# Patient Record
Sex: Female | Born: 1995 | Race: Black or African American | Hispanic: No | Marital: Single | Smoking: Light tobacco smoker
Health system: Southern US, Community
[De-identification: ages and names within clinical notes are randomized; demographics above are authoritative.]

## PROBLEM LIST (undated history)

## (undated) ENCOUNTER — Inpatient Hospital Stay (HOSPITAL_COMMUNITY): Payer: Self-pay

## (undated) DIAGNOSIS — A549 Gonococcal infection, unspecified: Secondary | ICD-10-CM

## (undated) DIAGNOSIS — N390 Urinary tract infection, site not specified: Secondary | ICD-10-CM

## (undated) DIAGNOSIS — A749 Chlamydial infection, unspecified: Secondary | ICD-10-CM

## (undated) HISTORY — PX: NO PAST SURGERIES: SHX2092

---
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
## 2017-08-22 ENCOUNTER — Encounter (HOSPITAL_COMMUNITY): Payer: Self-pay | Admitting: Emergency Medicine

## 2017-08-22 ENCOUNTER — Other Ambulatory Visit: Payer: Self-pay

## 2017-08-22 ENCOUNTER — Ambulatory Visit (HOSPITAL_COMMUNITY)
Admission: EM | Admit: 2017-08-22 | Discharge: 2017-08-22 | Disposition: A | Discharge status: Home / Self Care | Payer: Self-pay | Attending: Family Medicine | Admitting: Family Medicine

## 2017-08-22 DIAGNOSIS — M79644 Pain in right finger(s): Secondary | ICD-10-CM

## 2017-08-22 DIAGNOSIS — Z23 Encounter for immunization: Secondary | ICD-10-CM

## 2017-08-22 DIAGNOSIS — S60452A Superficial foreign body of right middle finger, initial encounter: Secondary | ICD-10-CM

## 2017-08-22 DIAGNOSIS — S60459A Superficial foreign body of unspecified finger, initial encounter: Secondary | ICD-10-CM

## 2017-08-22 MED ORDER — IBUPROFEN 800 MG PO TABS
800.0000 mg | ORAL_TABLET | Freq: Three times a day (TID) | ORAL | 0 refills | Status: DC
Start: 2017-08-22 — End: 2023-11-29

## 2017-08-22 MED ORDER — TETANUS-DIPHTH-ACELL PERTUSSIS 5-2.5-18.5 LF-MCG/0.5 IM SUSP
0.5000 mL | Freq: Once | INTRAMUSCULAR | Status: AC
  Administered 2017-08-22: 0.5 mL via INTRAMUSCULAR

## 2017-08-22 MED ORDER — CEPHALEXIN 500 MG PO CAPS: 500.0000 mg | ORAL_CAPSULE | Freq: Four times a day (QID) | ORAL | 0 refills | Status: AC

## 2017-08-22 MED ORDER — TETANUS-DIPHTH-ACELL PERTUSSIS 5-2.5-18.5 LF-MCG/0.5 IM SUSP
INTRAMUSCULAR | Status: AC
  Filled 2017-08-22: qty 0.5

## 2017-08-22 NOTE — Discharge Instructions (Signed)
Please begin Keflex to prevent infection Please monitor for signs of infection including increased redness, swelling, pain, drainage from the area, not healing If causing you significant pain, limiting your use of your finger please follow-up with hand specialist for further evaluation and removal.  Please take Tylenol or ibuprofen to help with your pain

## 2017-08-22 NOTE — ED Triage Notes (Signed)
Right middle finger injury occurred today.  Patient reports she was digging in purse and something poked finger.  Site is painful and she thinks she sees something in finger

## 2017-08-23 NOTE — ED Provider Notes (Signed)
[facility]    CSN: [id number] Arrival date & time: 08/22/17  1116     History   Chief Complaint Chief Complaint  Patient presents with  . Puncture Wound  . Finger Injury    HPI Sue Maldonado is a 22 y.o. female no significant past medical history presenting today for evaluation of right middle finger foreign body.  She states she was looking through her purse and something poked her finger.  She is unsure what this was, she states she was looking to her jewelry bag and may have been a comb.  She is here to have this piece removed.  Unsure last tetanus.  HPI  Past Medical History:  Diagnosis Date  . Chlamydia   . Gonorrhea   . UTI (lower urinary tract infection)     Patient Active Problem List   Diagnosis Date Noted  . NSVD (normal spontaneous vaginal delivery) 06/15/2015  . Gestational hypertension 06/13/2015    Past Surgical History:  Procedure Laterality Date  . NO PAST SURGERIES      OB History    Gravida  1   Para  1   Term  1   Preterm      AB      Living  1     SAB      TAB      Ectopic      Multiple  0   Live Births  1            Home Medications    Prior to Admission medications   Medication Sig Start Date End Date Taking? Authorizing Provider  cephALEXin (KEFLEX) 500 MG capsule Take 1 capsule (500 mg total) by mouth 4 (four) times daily for 5 days. 08/22/17 08/27/17  Jaydin Boniface C, PA-C  ibuprofen (ADVIL,MOTRIN) 800 MG tablet Take 1 tablet (800 mg total) by mouth 3 (three) times daily. 08/22/17   Kourtni Stineman C, PA-C  Prenatal Vit-Fe Fumarate-FA (PRENATAL MULTIVITAMIN) TABS tablet Take 1 tablet by mouth daily at 12 noon.    [provider]    Family History Family History  Problem Relation Age of Onset  . Cancer Maternal Aunt   . Hypertension Maternal Grandmother     Social History Social History   Tobacco Use  . Smoking status: Former Smoker  Substance Use Topics  . Alcohol use: No  . Drug  use: Yes    Types: Marijuana    Comment: 6 months ago     Allergies   Patient has no known allergies.   Review of Systems Review of Systems  Constitutional: Negative for fatigue and fever.  Eyes: Negative for visual disturbance.  Respiratory: Negative for shortness of breath.   Cardiovascular: Negative for chest pain.  Gastrointestinal: Negative for abdominal pain, nausea and vomiting.  Musculoskeletal: Negative for arthralgias and joint swelling.  Skin: Positive for color change. Negative for rash and wound.       Foreign body  Neurological: Negative for dizziness, weakness, light-headedness and headaches.     Physical Exam Triage Vital Signs ED Triage Vitals  Enc Vitals Group     BP 08/22/17 1222 (!) 132/95     Pulse Rate 08/22/17 1222 68     Resp 08/22/17 1222 16     Temp 08/22/17 1222 98.6 F (37 C)     Temp Source 08/22/17 1222 Oral     SpO2 08/22/17 1222 100 %     Weight --      Height --  Head Circumference --      Peak Flow --      Pain Score 08/22/17 1241 7     Pain Loc --      Pain Edu? --      Excl. in GC? --    No data found.  Updated Vital Signs BP (!) 132/95 (BP Location: Right Arm)   Pulse 68   Temp 98.6 F (37 C) (Oral)   Resp 16   LMP 08/21/2017   SpO2 100%   Visual Acuity Right Eye Distance:   Left Eye Distance:   Bilateral Distance:    Right Eye Near:   Left Eye Near:    Bilateral Near:     Physical Exam  Constitutional: She is oriented to person, place, and time. She appears well-developed and well-nourished.  No acute distress  HENT:  Head: Normocephalic and atraumatic.  Nose: Nose normal.  Eyes: Conjunctivae are normal.  Neck: Neck supple.  Cardiovascular: Normal rate.  Pulmonary/Chest: Effort normal. No respiratory distress.  Abdominal: She exhibits no distension.  Musculoskeletal: Normal range of motion.  Neurological: She is alert and oriented to person, place, and time.  Skin: Skin is warm and dry.  Visible  foreign body palpable and distal pulp of right middle finger.  Psychiatric: She has a normal mood and affect.  Nursing note and vitals reviewed.    UC Treatments / Results  Labs (all labs ordered are listed, but only abnormal results are displayed) Labs Reviewed - No data to display  EKG None  Radiology No results found.  Procedures Foreign Body Removal Date/Time: 08/23/2017 4:07 PM Performed by: Isra Lindy, Junius Creamer, PA-C Authorized by: Tyrone Nine, MD   Consent:    Consent obtained:  Verbal   Consent given by:  Patient   Risks discussed:  Pain, worsening of condition, incomplete removal, bleeding and infection   Alternatives discussed:  No treatment and alternative treatment Location:    Location:  Finger   Finger location:  R middle finger   Depth:  Intradermal   Tendon involvement:  None Pre-procedure details:    Imaging:  None   Neurovascular status: intact     Preparation: Patient was prepped and draped in usual sterile fashion   Anesthesia (see MAR for exact dosages):    Anesthesia method:  Nerve block   Block location:  Base of right middle finger   Block needle gauge:  27 G   Block anesthetic:  Lidocaine 2% w/o epi   Block technique:  2 separate insertion points   Block injection procedure:  Introduced needle and anatomic landmarks identified   Block outcome:  Anesthesia achieved Procedure type:    Procedure complexity:  Complex Procedure details:    Localization method:  Probed   Bloodless field: no     Removal mechanism:  Irrigation and forceps   Foreign bodies recovered:  None   Intact foreign body removal: no   Post-procedure details:    Neurovascular status: intact     Confirmation:  Residual foreign bodies remain   Skin closure:  None   Dressing:  Open (no dressing)   Patient tolerance of procedure:  Tolerated well, no immediate complications Comments:     No immediate complications   (including critical care time)  Medications Ordered in  UC Medications  Tdap (BOOSTRIX) injection 0.5 mL (0.5 mLs Intramuscular Given 08/22/17 1421)    Initial Impression / Assessment and Plan / UC Course  I have reviewed the triage vital signs and  the nursing notes.  Pertinent labs & imaging results that were available during my care of the patient were reviewed by me and considered in my medical decision making (see chart for details).     Given location on finger pulp, incomplete removal of foreign body, length of time of manipulation, will go ahead and empirically put on Keflex to prevent development of swelling or infection in the distal pulp.  Discussed monitoring for infection.  Foreign body was unable to be removed, advised if this becomes bothersome to her or developing infection to please follow-up for further evaluation.  May follow-up with hand if no improvement with the removal.  Tylenol and ibuprofen for pain.  Tetanus updated.  Discussed strict return precautions. Patient verbalized understanding and is agreeable with plan.  Final Clinical Impressions(s) / UC Diagnoses   Final diagnoses:  Foreign body in skin of finger, initial encounter     Discharge Instructions     Please begin Keflex to prevent infection Please monitor for signs of infection including increased redness, swelling, pain, drainage from the area, not healing If causing you significant pain, limiting your use of your finger please follow-up with hand specialist for further evaluation and removal.  Please take Tylenol or ibuprofen to help with your pain   ED Prescriptions    Medication Sig Dispense Auth. Provider   cephALEXin (KEFLEX) 500 MG capsule Take 1 capsule (500 mg total) by mouth 4 (four) times daily for 5 days. 20 capsule Ezme Duch C, PA-C   ibuprofen (ADVIL,MOTRIN) 800 MG tablet Take 1 tablet (800 mg total) by mouth 3 (three) times daily. 21 tablet Melat Wrisley, FeltonHallie C, PA-C     Controlled Substance Prescriptions [place] Controlled Substance  Registry consulted? Not Applicable   Lew DawesWieters, Ashawn Rinehart C, [place] JerseyPA-C 08/23/17 1610

## 2017-09-10 ENCOUNTER — Encounter (HOSPITAL_COMMUNITY): Payer: Self-pay | Admitting: Emergency Medicine

## 2017-09-10 ENCOUNTER — Emergency Department (HOSPITAL_COMMUNITY): Payer: Self-pay

## 2017-09-10 ENCOUNTER — Other Ambulatory Visit: Payer: Self-pay

## 2017-09-10 ENCOUNTER — Emergency Department (HOSPITAL_COMMUNITY)
Admission: EM | Admit: 2017-09-10 | Discharge: 2017-09-10 | Disposition: A | Discharge status: Home / Self Care | Payer: Self-pay | Attending: Emergency Medicine | Admitting: Emergency Medicine

## 2017-09-10 DIAGNOSIS — Y9389 Activity, other specified: Secondary | ICD-10-CM | POA: Insufficient documentation

## 2017-09-10 DIAGNOSIS — Y998 Other external cause status: Secondary | ICD-10-CM | POA: Insufficient documentation

## 2017-09-10 DIAGNOSIS — S60452A Superficial foreign body of right middle finger, initial encounter: Secondary | ICD-10-CM | POA: Insufficient documentation

## 2017-09-10 DIAGNOSIS — X58XXXA Exposure to other specified factors, initial encounter: Secondary | ICD-10-CM | POA: Insufficient documentation

## 2017-09-10 DIAGNOSIS — Y929 Unspecified place or not applicable: Secondary | ICD-10-CM | POA: Insufficient documentation

## 2017-09-10 MED ORDER — CEPHALEXIN 500 MG PO CAPS: 500.0000 mg | ORAL_CAPSULE | Freq: Four times a day (QID) | ORAL | 0 refills | Status: AC

## 2017-09-10 MED ORDER — LIDOCAINE HCL (PF) 1 % IJ SOLN
5.0000 mL | Freq: Once | INTRAMUSCULAR | Status: AC
  Administered 2017-09-10: 5 mL via INTRADERMAL
  Filled 2017-09-10: qty 5

## 2017-09-10 NOTE — Discharge Instructions (Addendum)
Take antibiotics as prescribed. Follow up with hand specialist for foreign body removal.

## 2017-09-10 NOTE — ED Notes (Signed)
ED Provider at bedside. 

## 2017-09-10 NOTE — ED Provider Notes (Signed)
[facility] Provider Note   CSN: [id number] Arrival date & time: 09/10/17  [id number]     History   Chief Complaint Chief Complaint  Patient presents with  . Hand Pain    HPI Sue Maldonado is a 22 y.o. female.  22 year old female presents with complaint of right third finger foreign body.  Patient states that on July 3 she put her hand into her purse and believes she had a piece of her hair, stuck in her finger, went to urgent care who was unable to successfully remove the foreign body.  Patient was given Keflex prophylactically, did not fill the prescription as her insurance did not pay for it.  Patient reports developing pain and swelling in her distal right third finger, no drainage from the area.  No other complaints or concerns.     Past Medical History:  Diagnosis Date  . Chlamydia   . Gonorrhea   . UTI (lower urinary tract infection)     Patient Active Problem List   Diagnosis Date Noted  . NSVD (normal spontaneous vaginal delivery) 06/15/2015  . Gestational hypertension 06/13/2015    Past Surgical History:  Procedure Laterality Date  . NO PAST SURGERIES       OB History    Gravida  1   Para  1   Term  1   Preterm      AB      Living  1     SAB      TAB      Ectopic      Multiple  0   Live Births  1            Home Medications    Prior to Admission medications   Medication Sig Start Date End Date Taking? Authorizing Provider  cephALEXin (KEFLEX) 500 MG capsule Take 1 capsule (500 mg total) by mouth 4 (four) times daily for 7 days. 09/10/17 09/17/17  Jeannie FendMurphy, Necha Harries A, PA-C  ibuprofen (ADVIL,MOTRIN) 800 MG tablet Take 1 tablet (800 mg total) by mouth 3 (three) times daily. 08/22/17   Wieters, Hallie C, PA-C  Prenatal Vit-Fe Fumarate-FA (PRENATAL MULTIVITAMIN) TABS tablet Take 1 tablet by mouth daily at 12 noon.    [provider]    Family History Family History  Problem Relation Age of Onset   . Cancer Maternal Aunt   . Hypertension Maternal Grandmother     Social History Social History   Tobacco Use  . Smoking status: Former Games developermoker  . Smokeless tobacco: Never Used  Substance Use Topics  . Alcohol use: No  . Drug use: Yes    Types: Marijuana    Comment: 6 months ago     Allergies   Patient has no known allergies.   Review of Systems Review of Systems  Constitutional: Negative for fever.  Musculoskeletal: Negative for arthralgias.  Skin: Positive for color change and wound.  Allergic/Immunologic: Negative for immunocompromised state.  Neurological: Negative for weakness and numbness.  Hematological: Negative for adenopathy.  Psychiatric/Behavioral: Negative for confusion.  All other systems reviewed and are negative.    Physical Exam Updated Vital Signs BP (!) 129/93 (BP Location: Right Arm)   Pulse 73   Temp 98.2 F (36.8 C) (Oral)   Resp 17   Ht 5\' 4"  (1.626 m)   Wt 68 kg (150 lb)   LMP 08/21/2017   SpO2 100%   BMI 25.75 kg/m   Physical Exam  Constitutional: She is oriented  to person, place, and time. She appears well-developed and well-nourished. No distress.  HENT:  Head: Normocephalic and atraumatic.  Cardiovascular: Intact distal pulses.  Pulmonary/Chest: Effort normal.  Musculoskeletal: She exhibits tenderness. She exhibits no deformity.       Hands: Neurological: She is alert and oriented to person, place, and time.  Skin: Skin is warm and dry. She is not diaphoretic. There is erythema.  Psychiatric: She has a normal mood and affect. Her behavior is normal.  Nursing note and vitals reviewed.    ED Treatments / Results  Labs (all labs ordered are listed, but only abnormal results are displayed) Labs Reviewed - No data to display  EKG None  Radiology Dg Finger Middle Right  Result Date: 09/10/2017 CLINICAL DATA:  Injury to tip of right middle finger by an unknown object in her purse at the beginning of the month. EXAM: RIGHT  MIDDLE FINGER 2+V COMPARISON:  None. FINDINGS: Three views of the RIGHT middle finger are provided. There is a thin linear foreign body within the soft tissues ventral to the distal phalanx, projecting towards the radial aspects of the distal phalanx, measuring 7 mm. Underlying distal phalanx appears intact and normally aligned. IMPRESSION: 1. Linear foreign body within the soft tissues ventral to the distal phalanx, measuring 7 mm. 2. No osseous abnormality. Electronically Signed   By: Bary Richard M.D.   On: 09/10/2017 10:53    Procedures .Foreign Body Removal Date/Time: 09/10/2017 12:19 PM Performed by: Jeannie Fend, PA-C Authorized by: Jeannie Fend, PA-C  Consent: Verbal consent obtained. Written consent not obtained. Consent given by: patient Patient understanding: patient states understanding of the procedure being performed Patient identity confirmed: verbally with patient and arm band Body area: skin General location: upper extremity Location details: right long finger Anesthesia: digital block  Anesthesia: Local Anesthetic: lidocaine 1% without epinephrine  Sedation: Patient sedated: no  Patient restrained: no Patient cooperative: yes Localization method: probed 0 objects recovered. Patient tolerance: Patient tolerated the procedure well with no immediate complications   (including critical care time)  Medications Ordered in ED Medications  lidocaine (PF) (XYLOCAINE) 1 % injection 5 mL (5 mLs Intradermal Given 09/10/17 1112)     Initial Impression / Assessment and Plan / ED Course  I have reviewed the triage vital signs and the nursing notes.  Pertinent labs & imaging results that were available during my care of the patient were reviewed by me and considered in my medical decision making (see chart for details).  Clinical Course as of Sep 10 1228  Mon Sep 10, 2017  [id number] 22 year old female presents with foreign body in her right third finger x18 days.   Patient went to urgent care initially was unable to locate the foreign body for removal, given a prescription for Keflex which she did not take.  Patient returns today for worsening pain and swelling in her right distal third finger.  On exam there is swelling with tenderness, mild erythema, no streaking, no tenderness along the flexor extensor tendons of the finger.  X-ray shows visible foreign body in the distal right third phalanx.Discussed foreign body with patient, offered referral versus attempt removal in the emergency room today.  Patient requests attempt removal today.  Digital block placed and tourniquet placed on finger.  11 blade was used to open the finger at the original puncture site wound.  There is a trace amount of purulent drainage present.  Unable to locate the foreign body.  Patient will be referred  to hand Ortho for follow-up.  Give him prescription for antibiotics again as well as good Rx card and advised patient she needs to take this prescription due to developing infection in her finger and follow-up.  Patient verbalizes understanding of discharge instructions and plan.   [LM]    Clinical Course User Index [LM] Jeannie Fend, PA-C    Final Clinical Impressions(s) / ED Diagnoses   Final diagnoses:  Foreign body of right middle finger    ED Discharge Orders        Ordered    cephALEXin (KEFLEX) 500 MG capsule  4 times daily     09/10/17 1209       Jeannie Fend, PA-C 09/10/17 1230    Gwyneth Sprout, MD 09/10/17 2040

## 2017-09-10 NOTE — ED Triage Notes (Signed)
Right middle finger pain for a few weeks. States she went to [facility] they rx an antibiotic but she didn't take it because medicaid would not pay for it. Skin intact.

## 2018-06-19 ENCOUNTER — Encounter (HOSPITAL_COMMUNITY): Payer: Self-pay

## 2018-06-19 ENCOUNTER — Ambulatory Visit (HOSPITAL_COMMUNITY)
Admission: EM | Admit: 2018-06-19 | Discharge: 2018-06-19 | Disposition: A | Discharge status: Home / Self Care | Payer: Medicaid Other | Attending: Family Medicine | Admitting: Family Medicine

## 2018-06-19 ENCOUNTER — Other Ambulatory Visit: Payer: Self-pay

## 2018-06-19 DIAGNOSIS — J3489 Other specified disorders of nose and nasal sinuses: Secondary | ICD-10-CM

## 2018-06-19 DIAGNOSIS — F1410 Cocaine abuse, uncomplicated: Secondary | ICD-10-CM

## 2018-06-19 NOTE — ED Notes (Signed)
Patient verbalizes understanding of discharge instructions. Opportunity for questioning and answers were provided. Patient discharged from [facility] by MD. 

## 2018-06-19 NOTE — ED Triage Notes (Addendum)
Patient presents to Urgent Care with complaints of nasal pain since "seeing that she had holes in her nose" about a week ago because she has been doing so much cocaine. Patient states she has been trying tylenol for pain, took 650 mg today.

## 2018-06-25 NOTE — ED Provider Notes (Signed)
[facility]   [id number] 06/19/18 Arrival Time: 1104  ASSESSMENT & PLAN:  1. Nasal septal defect   2. Cocaine abuse (HCC)    No sign of infection. Discussed. She has reportedly stopping cocaine use over the past 1-2 weeks. May f/u as needed.  Reviewed expectations re: course of current medical issues. Questions answered. Outlined signs and symptoms indicating need for more acute intervention. Patient verbalized understanding. After Visit Summary given.   SUBJECTIVE:  Sue Maldonado is a 23 y.o. female who reports "noticing a hold in my nose". Slight nasal irritation lately. No nasal drainage or bleeding. Reports on/off use of snorted cocaine. "Cannot smell as well either"; has noticed for a few months. No nasal pain or swelling. No specific aggravating or alleviating factors reported.  ROS: As per HPI.  OBJECTIVE: Vitals:   06/19/18 1159  BP: (!) 150/97  Pulse: (!) 101  Resp: 18  Temp: 98.1 F (36.7 C)  TempSrc: Oral  SpO2: 100%    Recheck Pulse: 94  General appearance: alert; no distress HENT: normocephalic; atraumatic; open nasal septum defect without mucosal irritation or bleeding Neck: supple with FROM Lungs: normal respirations; unlabored Skin: warm and dry Psychological: alert and cooperative; normal mood and affect  No Known Allergies  Past Medical History:  Diagnosis Date  . Chlamydia   . Gonorrhea   . UTI (lower urinary tract infection)    Social History   Socioeconomic History  . Marital status: Single    Spouse name: Not on file  . Number of children: Not on file  . Years of education: Not on file  . Highest education level: Not on file  Occupational History  . Not on file  Social Needs  . Financial resource strain: Not on file  . Food insecurity:    Worry: Not on file    Inability: Not on file  . Transportation needs:    Medical: Not on file    Non-medical: Not on file  Tobacco Use  . Smoking status: Light Tobacco Smoker   . Smokeless tobacco: Never Used  . Tobacco comment: 2-3 per day  Substance and Sexual Activity  . Alcohol use: Yes    Comment: socially  . Drug use: Yes    Types: Cocaine    Comment: "that's what caused the holes in my nose"  . Sexual activity: Never    Birth control/protection: Implant    Comment: nexplanon  Lifestyle  . Physical activity:    Days per week: Not on file    Minutes per session: Not on file  . Stress: Not on file  Relationships  . Social connections:    Talks on phone: Not on file    Gets together: Not on file    Attends religious service: Not on file    Active member of club or organization: Not on file    Attends meetings of clubs or organizations: Not on file    Relationship status: Not on file  . Intimate partner violence:    Fear of current or ex partner: Not on file    Emotionally abused: Not on file    Physically abused: Not on file    Forced sexual activity: Not on file  Other Topics Concern  . Not on file  Social History Narrative  . Not on file   Family History  Problem Relation Age of Onset  . Cancer Maternal Aunt   . Hypertension Maternal Grandmother    Past Surgical History:  Procedure Laterality  Date  . NO PAST SURGERIES       Mardella Layman, MD 06/25/18 [phone]

## 2020-04-16 IMAGING — DX DG FINGER MIDDLE 2+V*R*
1 series · 3 of 3 positions shown · non-contrast
Comparison: None.

CLINICAL DATA: Injury to tip of right middle finger by an unknown
object in her purse at the beginning of the month.

EXAM:
RIGHT MIDDLE FINGER 2+V

[Series 1: finger · 0.14mm/px · 3 of 3 slices shown]
[im 1/3]
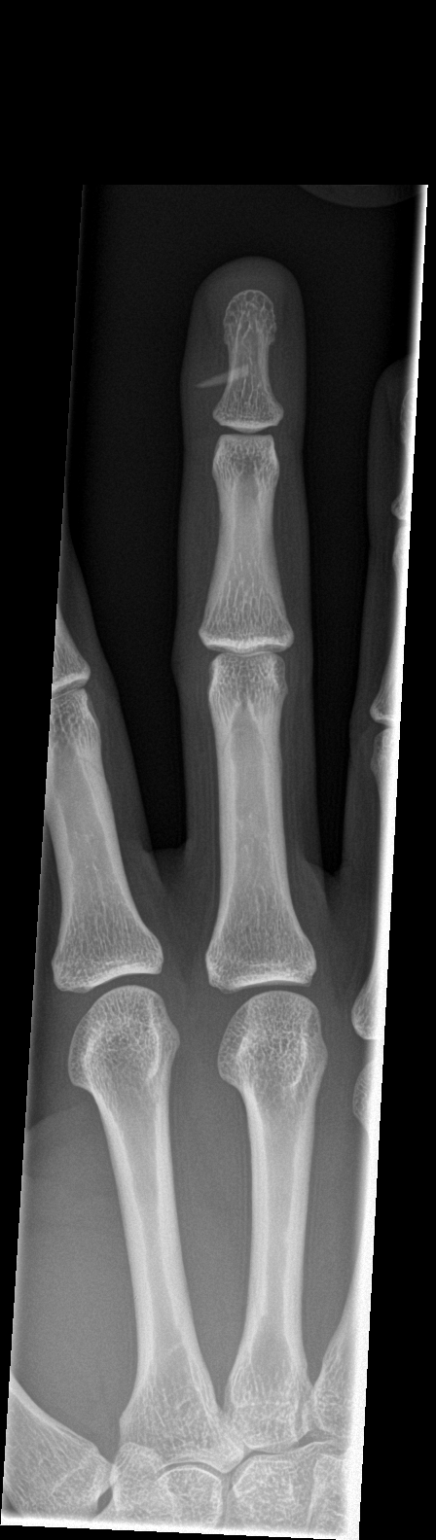
[im 2/3]
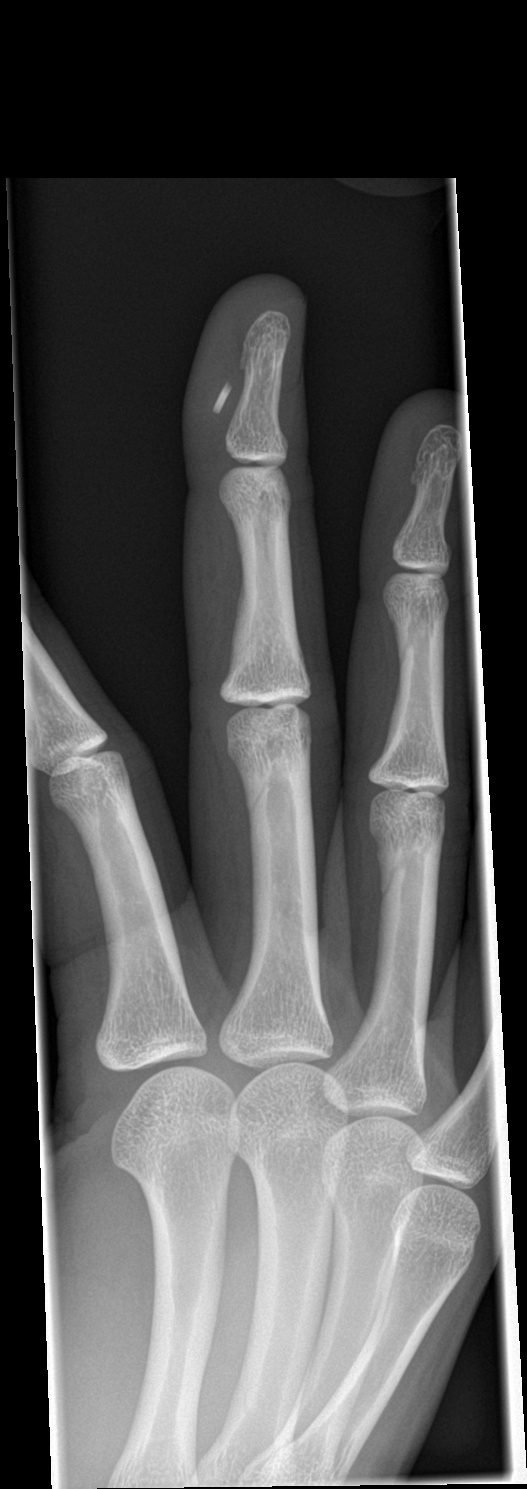
[im 3/3]
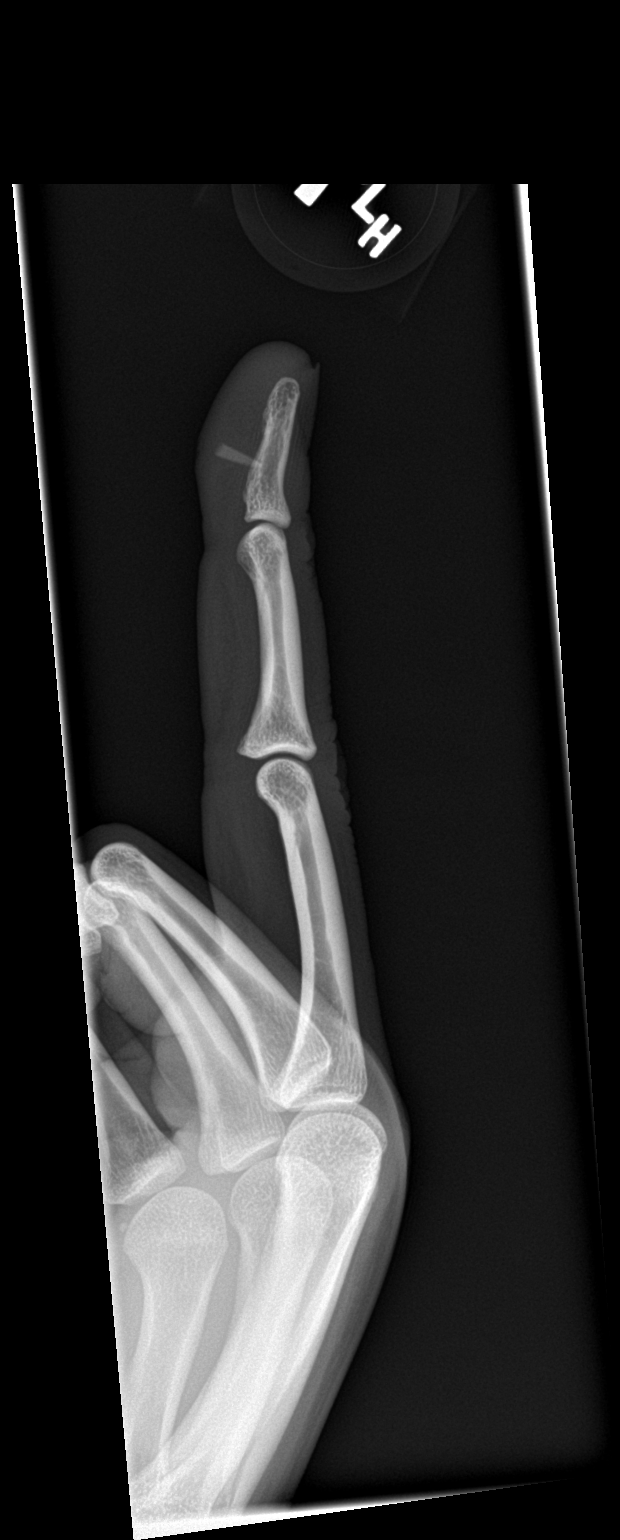

[3 of 3 positions shown; findings below may reference images not displayed]

FINDINGS: Three views of the RIGHT middle finger are provided. There is a thin
linear foreign body within the soft tissues ventral to the distal
phalanx, projecting towards the radial aspects of the distal
phalanx, measuring 7 mm. Underlying distal phalanx appears intact
and normally aligned.
IMPRESSION: 1. Linear foreign body within the soft tissues ventral to the distal
phalanx, measuring 7 mm.
2. No osseous abnormality.

## 2023-05-19 ENCOUNTER — Telehealth: Admitting: Physician Assistant

## 2023-05-19 ENCOUNTER — Encounter: Payer: Self-pay | Admitting: Physician Assistant

## 2023-05-19 DIAGNOSIS — R112 Nausea with vomiting, unspecified: Secondary | ICD-10-CM | POA: Diagnosis not present

## 2023-05-19 MED ORDER — ONDANSETRON 4 MG PO TBDP: 4.0000 mg | ORAL_TABLET | Freq: Three times a day (TID) | ORAL | 0 refills | Status: DC | PRN

## 2023-05-19 NOTE — Progress Notes (Signed)
 E-Visit for Vomiting  We are sorry that you are not feeling well. Here is how we plan to help!  Based on what you have shared with me it looks like you have a Virus that is irritating your GI tract.  Vomiting is the forceful emptying of a portion of the stomach's content through the mouth.  Although nausea and vomiting can make you feel miserable, it's important to remember that these are not diseases, but rather symptoms of an underlying illness.  When we treat short term symptoms, we always caution that any symptoms that persist should be fully evaluated in a medical office.  I have prescribed a medication that will help alleviate your symptoms and allow you to stay hydrated:  Zofran 4 mg 1 tablet every 8 hours as needed for nausea and vomiting  HOME CARE: Drink clear liquids.  This is very important! Dehydration (the lack of fluid) can lead to a serious complication.  Start off with 1 tablespoon every 5 minutes for 8 hours. You may begin eating bland foods after 8 hours without vomiting.  Start with saltine crackers, white bread, rice, mashed potatoes, applesauce. After 48 hours on a bland diet, you may resume a normal diet. Try to go to sleep.  Sleep often empties the stomach and relieves the need to vomit.  GET HELP RIGHT AWAY IF:  Your symptoms do not improve or worsen within 2 days after treatment. You have a fever for over 3 days. You cannot keep down fluids after trying the medication.  MAKE SURE YOU:  Understand these instructions. Will watch your condition. Will get help right away if you are not doing well or get worse.   Thank you for choosing an e-visit.  Your e-visit answers were reviewed by a board certified advanced clinical practitioner to complete your personal care plan. Depending upon the condition, your plan could have included both over the counter or prescription medications.  Please review your pharmacy choice. Make sure the pharmacy is open so you can pick  up prescription now. If there is a problem, you may contact your provider through [employer] and have the prescription routed to another pharmacy.  Your safety is important to [place]. If you have drug allergies check your prescription carefully.   For the next 24 hours you can use MyChart to ask questions about today's visit, request a non-urgent call back, or ask for a work or school excuse. You will get an email in the next two days asking about your experience. I hope that your e-visit has been valuable and will speed your recovery.  I have spent 5 minutes in review of e-visit questionnaire, review and updating patient chart, medical decision making and response to patient.   Kasandra Knudsen Mayers, PA-C

## 2023-07-13 ENCOUNTER — Encounter (HOSPITAL_COMMUNITY): Payer: Self-pay

## 2023-07-13 ENCOUNTER — Other Ambulatory Visit: Payer: Self-pay

## 2023-07-13 ENCOUNTER — Emergency Department (HOSPITAL_COMMUNITY): Admission: EM | Admit: 2023-07-13 | Discharge: 2023-07-13 | Disposition: A | Discharge status: Home / Self Care

## 2023-07-13 DIAGNOSIS — R1013 Epigastric pain: Secondary | ICD-10-CM | POA: Diagnosis present

## 2023-07-13 DIAGNOSIS — Y903 Blood alcohol level of 60-79 mg/100 ml: Secondary | ICD-10-CM | POA: Insufficient documentation

## 2023-07-13 DIAGNOSIS — F10129 Alcohol abuse with intoxication, unspecified: Secondary | ICD-10-CM | POA: Insufficient documentation

## 2023-07-13 DIAGNOSIS — F10120 Alcohol abuse with intoxication, uncomplicated: Secondary | ICD-10-CM

## 2023-07-13 LAB — CBC
HCT: 42.9 % (ref 36.0–46.0)
Hemoglobin: 14.1 g/dL (ref 12.0–15.0)
MCH: 29.4 pg (ref 26.0–34.0)
MCHC: 32.9 g/dL (ref 30.0–36.0)
MCV: 89.6 fL (ref 80.0–100.0)
Platelets: 392 10*3/uL (ref 150–400)
RBC: 4.79 MIL/uL (ref 3.87–5.11)
RDW: 12.8 % (ref 11.5–15.5)
WBC: 10.5 10*3/uL (ref 4.0–10.5)
nRBC: 0 % (ref 0.0–0.2)

## 2023-07-13 LAB — HCG, SERUM, QUALITATIVE: Preg, Serum: NEGATIVE

## 2023-07-13 LAB — COMPREHENSIVE METABOLIC PANEL WITH GFR
ALT: 12 U/L (ref 0–44)
AST: 16 U/L (ref 15–41)
Albumin: 3.9 g/dL (ref 3.5–5.0)
Alkaline Phosphatase: 52 U/L (ref 38–126)
Anion gap: 10 (ref 5–15)
BUN: 11 mg/dL (ref 6–20)
CO2: 24 mmol/L (ref 22–32)
Calcium: 9.6 mg/dL (ref 8.9–10.3)
Chloride: 106 mmol/L (ref 98–111)
Creatinine, Ser: 0.5 mg/dL (ref 0.44–1.00)
GFR, Estimated: >60 mL/min (ref >60)
Glucose, Bld: 126 mg/dL — ABNORMAL HIGH (ref 70–99)
Potassium: 3.9 mmol/L (ref 3.5–5.1)
Sodium: 140 mmol/L (ref 135–145)
Total Bilirubin: 0.8 mg/dL (ref 0.0–1.2)
Total Protein: 8.3 g/dL — ABNORMAL HIGH (ref 6.5–8.1)

## 2023-07-13 LAB — ETHANOL: Alcohol, Ethyl (B): 67 mg/dL — ABNORMAL HIGH (ref <15)

## 2023-07-13 LAB — TROPONIN I (HIGH SENSITIVITY): Troponin I (High Sensitivity): 3 ng/L (ref <18)

## 2023-07-13 MED ORDER — ONDANSETRON 4 MG PO TBDP: 4.0000 mg | ORAL_TABLET | Freq: Three times a day (TID) | ORAL | 0 refills | Status: DC | PRN

## 2023-07-13 MED ORDER — ALUM & MAG HYDROXIDE-SIMETH 200-200-20 MG/5ML PO SUSP
30.0000 mL | Freq: Once | ORAL | Status: AC
  Administered 2023-07-13: 30 mL via ORAL
  Filled 2023-07-13: qty 30

## 2023-07-13 MED ORDER — ONDANSETRON HCL 4 MG/2ML IJ SOLN
4.0000 mg | Freq: Once | INTRAMUSCULAR | Status: AC
  Administered 2023-07-13: 4 mg via INTRAVENOUS
  Filled 2023-07-13: qty 2

## 2023-07-13 MED ORDER — LACTATED RINGERS IV BOLUS
1000.0000 mL | Freq: Once | INTRAVENOUS | Status: AC
  Administered 2023-07-13: 1000 mL via INTRAVENOUS

## 2023-07-13 NOTE — Discharge Instructions (Addendum)
 Your workup today was reassuring.  Please take the Zofran  as needed for nausea.  Drink plenty of fluids and follow-up with your doctor as needed.  Return to the ER for worsening symptoms.

## 2023-07-13 NOTE — ED Provider Notes (Signed)
  EMERGENCY DEPARTMENT AT [facility] Provider Note   CSN: [id number] Arrival date & time: 07/13/23  0747     History  Chief Complaint  Patient presents with   Alcohol Problem   Drug / Alcohol Assessment    Sue Maldonado is a 28 y.o. female.  28 year old female presenting to the emergency department today with chest burning and epigastric pain.  Patient is also having some nausea.  She states that she drank too much liquor last night and did cocaine.  She states that she has had multiple episodes of nonbloody, nonbilious emesis this morning.  She states that she is having's pain in the epigastrium.  She states that she feels dehydrated.  She came to the ER today for further evaluation regarding this.  She denies any fevers or chills.  States she was in her normal state of health until this this morning.   Alcohol Problem Associated symptoms include abdominal pain.  Drug / Alcohol Assessment Associated symptoms: abdominal pain, nausea and vomiting        Home Medications Prior to Admission medications   Medication Sig Start Date End Date Taking? Authorizing Provider  ondansetron  (ZOFRAN -ODT) 4 MG disintegrating tablet Take 1 tablet (4 mg total) by mouth every 8 (eight) hours as needed for nausea or vomiting. 07/13/23  Yes Carin Charleston, MD  ibuprofen  (ADVIL ,MOTRIN ) 800 MG tablet Take 1 tablet (800 mg total) by mouth 3 (three) times daily. 08/22/17   Wieters, Hallie C, PA-C  ondansetron  (ZOFRAN -ODT) 4 MG disintegrating tablet Take 1 tablet (4 mg total) by mouth every 8 (eight) hours as needed for nausea or vomiting. 05/19/23   Mayers, Cari S, PA-C  Prenatal Vit-Fe Fumarate-FA (PRENATAL MULTIVITAMIN) TABS tablet Take 1 tablet by mouth daily at 12 noon.    [provider]      Allergies    Bee pollen    Review of Systems   Review of Systems  Gastrointestinal:  Positive for abdominal pain, nausea and vomiting.  All other systems reviewed and are  negative.   Physical Exam Updated Vital Signs BP (!) 142/78 (BP Location: Right Arm)   Pulse 86   Temp 97.8 F (36.6 C) (Oral)   Resp 17   Ht 5\' 5"  (1.651 m)   Wt 99.8 kg   LMP 06/13/2023 (Approximate)   SpO2 98%   Breastfeeding No   BMI 36.61 kg/m  Physical Exam Vitals and nursing note reviewed.   Gen: NAD Eyes: PERRL, EOMI HEENT: no oropharyngeal swelling Neck: trachea midline Resp: clear to auscultation bilaterally Card: Tachycardic, no murmurs, rubs, or gallops Abd: Mild epigastric tenderness with no guarding or rebound Extremities: no calf tenderness, no edema Vascular: 2+ radial pulses bilaterally, 2+ DP pulses bilaterally Neuro: No focal deficits Skin: no rashes Psyc: acting appropriately   ED Results / Procedures / Treatments   Labs (all labs ordered are listed, but only abnormal results are displayed) Labs Reviewed  COMPREHENSIVE METABOLIC PANEL WITH GFR - Abnormal; Notable for the following components:      Result Value   Glucose, Bld 126 (*)    Total Protein 8.3 (*)    All other components within normal limits  ETHANOL - Abnormal; Notable for the following components:   Alcohol, Ethyl (B) 67 (*)    All other components within normal limits  CBC  HCG, SERUM, QUALITATIVE  TROPONIN I (HIGH SENSITIVITY)    EKG EKG Interpretation Date/Time:  Friday Jul 13 2023 09:00:50 EDT Ventricular Rate:  81 PR Interval:  152 QRS Duration:  82 QT Interval:  370 QTC Calculation: 429 R Axis:   75  Text Interpretation: Normal sinus rhythm Normal ECG No previous ECGs available Confirmed by Abner Hoffman [phone]) on 07/13/2023 9:03:40 AM  Radiology No results found.  Procedures Procedures    Medications Ordered in ED Medications  lactated ringers  bolus 1,000 mL (0 mLs Intravenous Stopped 07/13/23 0942)  ondansetron  (ZOFRAN ) injection 4 mg (4 mg Intravenous Given 07/13/23 0835)  alum & mag hydroxide-simeth (MAALOX/MYLANTA) 200-200-20 MG/5ML suspension 30 mL (30  mLs Oral Given 07/13/23 1914)    ED Course/ Medical Decision Making/ A&P                                 Medical Decision Making 28 year old female with no reported past medical history presenting to the emergency department today with epigastric discomfort and multiple episodes of nausea and vomiting after drinking alcohol and using cocaine last night.  Will further evaluate her here with basic labs.  Will give her IV fluids here as well as Zofran .  Will obtain an EKG and troponin to evaluate for any ischemic injury from the cocaine use.  She is complaining mostly of epigastric and lower chest burning.  I suspect this is likely secondary to the vomiting.  Will give her Zofran  as well as GI cocktail here for this.  Suspicion for aortic dissection is low at this time given reassuring neurovascular exam and the patient is otherwise well-appearing.  I will reevaluate for ultimate disposition.  The patient's labs here are reassuring.  Her vital signs have improved with fluids and medications.  She is tolerating p.o.  She is discharged with return precautions.  Amount and/or Complexity of Data Reviewed Labs: ordered.  Risk OTC drugs. Prescription drug management.           Final Clinical Impression(s) / ED Diagnoses Final diagnoses:  Hangover without complication (HCC)    Rx / DC Orders ED Discharge Orders          Ordered    ondansetron  (ZOFRAN -ODT) 4 MG disintegrating tablet  Every 8 hours PRN        07/13/23 1033              Carin Charleston, MD 07/13/23 1034

## 2023-07-13 NOTE — ED Triage Notes (Addendum)
 Pt states she drank too much liquor last night and did cocaine and feels bad. C/O nausea. Denies CP

## 2023-09-22 ENCOUNTER — Encounter: Payer: Self-pay | Admitting: Emergency Medicine

## 2023-09-22 ENCOUNTER — Ambulatory Visit
Admission: EM | Admit: 2023-09-22 | Discharge: 2023-09-22 | Disposition: A | Discharge status: Home / Self Care | Attending: Internal Medicine | Admitting: Internal Medicine

## 2023-09-22 DIAGNOSIS — J028 Acute pharyngitis due to other specified organisms: Secondary | ICD-10-CM

## 2023-09-22 LAB — POCT RAPID STREP A (OFFICE): Rapid Strep A Screen: NEGATIVE

## 2023-09-22 MED ORDER — AMOXICILLIN 875 MG PO TABS: 875.0000 mg | ORAL_TABLET | Freq: Two times a day (BID) | ORAL | 0 refills | Status: AC

## 2023-09-22 NOTE — Discharge Instructions (Addendum)
 Sore throat with low-grade fever.  Strep testing done today was negative however symptoms, duration and physical exam findings are consistent with a bacterial pharyngitis.  We will treat this with antibiotics.  We will treat with the following: Amoxicillin  875 mg twice daily for 10 days.  This is an antibiotic.  Take this with food.  Alternate Tylenol  and ibuprofen  for pain and fevers Make sure to drink plenty of water even if you do not feel like eating solid foods Can try gargling with warm salt water to help with the pain Return to urgent care or PCP if symptoms worsen or fail to resolve.

## 2023-09-22 NOTE — ED Provider Notes (Signed)
 [facility]    CSN: [id number] Arrival date & time: 09/22/23  0813      History   Chief Complaint Chief Complaint  Patient presents with   Sore Throat    HPI Sue Maldonado is a 28 y.o. female.   28 year old female who presents urgent care with complaints of a sore throat and trouble swallowing.  She has also been running a low-grade fever.  She reports that her symptoms started about 6 days ago.  She has been using over-the-counter Tylenol  and ibuprofen  for symptoms but without improvement.  She denies cough, congestion, shortness of breath, chest pain.  She has not had any sick contacts.   Sore Throat Pertinent negatives include no chest pain, no abdominal pain and no shortness of breath.    Past Medical History:  Diagnosis Date   Chlamydia    Gonorrhea    UTI (lower urinary tract infection)     Patient Active Problem List   Diagnosis Date Noted   NSVD (normal spontaneous vaginal delivery) 06/15/2015   Gestational hypertension 06/13/2015    Past Surgical History:  Procedure Laterality Date   NO PAST SURGERIES      OB History     Gravida  1   Para  1   Term  1   Preterm      AB      Living  1      SAB      IAB      Ectopic      Multiple  0   Live Births  1            Home Medications    Prior to Admission medications   Medication Sig Start Date End Date Taking? Authorizing Provider  ibuprofen  (ADVIL ,MOTRIN ) 800 MG tablet Take 1 tablet (800 mg total) by mouth 3 (three) times daily. Patient not taking: Reported on 09/22/2023 08/22/17   Wieters, Hallie C, PA-C  ondansetron  (ZOFRAN -ODT) 4 MG disintegrating tablet Take 1 tablet (4 mg total) by mouth every 8 (eight) hours as needed for nausea or vomiting. Patient not taking: Reported on 09/22/2023 05/19/23   Mayers, Cari S, PA-C  ondansetron  (ZOFRAN -ODT) 4 MG disintegrating tablet Take 1 tablet (4 mg total) by mouth every 8 (eight) hours as needed for nausea or vomiting. Patient not  taking: Reported on 09/22/2023 07/13/23   Ula Prentice SAUNDERS, MD  Prenatal Vit-Fe Fumarate-FA (PRENATAL MULTIVITAMIN) TABS tablet Take 1 tablet by mouth daily at 12 noon. Patient not taking: Reported on 09/22/2023    [provider]    Family History Family History  Problem Relation Age of Onset   Cancer Maternal Aunt    Hypertension Maternal Grandmother     Social History Social History   Tobacco Use   Smoking status: Light Smoker   Smokeless tobacco: Never   Tobacco comments:    2-3 per day  Vaping Use   Vaping status: Never Used  Substance Use Topics   Alcohol use: Yes    Comment: socially   Drug use: Not Currently    Types: Cocaine    Comment: that's what caused the holes in my nose     Allergies   Bee pollen   Review of Systems Review of Systems  Constitutional:  Positive for fever. Negative for chills.  HENT:  Positive for sore throat and trouble swallowing. Negative for ear pain.   Eyes:  Negative for pain and visual disturbance.  Respiratory:  Negative for cough and shortness  of breath.   Cardiovascular:  Negative for chest pain and palpitations.  Gastrointestinal:  Negative for abdominal pain and vomiting.  Genitourinary:  Negative for dysuria and hematuria.  Musculoskeletal:  Negative for arthralgias and back pain.  Skin:  Negative for color change and rash.  Neurological:  Negative for seizures and syncope.  All other systems reviewed and are negative.    Physical Exam Triage Vital Signs ED Triage Vitals  Encounter Vitals Group     BP 09/22/23 0926 134/89     Girls Systolic BP Percentile --      Girls Diastolic BP Percentile --      Boys Systolic BP Percentile --      Boys Diastolic BP Percentile --      Pulse Rate 09/22/23 0926 93     Resp 09/22/23 0926 20     Temp 09/22/23 0926 100.2 F (37.9 C)     Temp Source 09/22/23 0926 Oral     SpO2 09/22/23 0926 98 %     Weight --      Height --      Head Circumference --      Peak Flow --       Pain Score 09/22/23 0927 8     Pain Loc --      Pain Education --      Exclude from Growth Chart --    No data found.  Updated Vital Signs BP 134/89 (BP Location: Left Arm)   Pulse 93   Temp 100.2 F (37.9 C) (Oral)   Resp 20   LMP 09/20/2023 (Exact Date)   SpO2 98%   Visual Acuity Right Eye Distance:   Left Eye Distance:   Bilateral Distance:    Right Eye Near:   Left Eye Near:    Bilateral Near:     Physical Exam Vitals and nursing note reviewed.  Constitutional:      General: She is not in acute distress.    Appearance: She is well-developed.  HENT:     Head: Normocephalic and atraumatic.     Right Ear: Tympanic membrane normal.     Left Ear: Tympanic membrane normal.     Mouth/Throat:     Mouth: Mucous membranes are moist.     Pharynx: Pharyngeal swelling, oropharyngeal exudate and posterior oropharyngeal erythema present.  Eyes:     Conjunctiva/sclera: Conjunctivae normal.  Cardiovascular:     Rate and Rhythm: Normal rate and regular rhythm.     Heart sounds: No murmur heard. Pulmonary:     Effort: Pulmonary effort is normal. No respiratory distress.     Breath sounds: Normal breath sounds.  Abdominal:     Palpations: Abdomen is soft.     Tenderness: There is no abdominal tenderness.  Musculoskeletal:        General: No swelling.     Cervical back: Neck supple.  Skin:    General: Skin is warm and dry.     Capillary Refill: Capillary refill takes less than 2 seconds.  Neurological:     Mental Status: She is alert.  Psychiatric:        Mood and Affect: Mood normal.      UC Treatments / Results  Labs (all labs ordered are listed, but only abnormal results are displayed) Labs Reviewed  POCT RAPID STREP A (OFFICE) - Normal    EKG   Radiology No results found.  Procedures Procedures (including critical care time)  Medications Ordered in UC Medications - No data  to display  Initial Impression / Assessment and Plan / UC Course  I have  reviewed the triage vital signs and the nursing notes.  Pertinent labs & imaging results that were available during my care of the patient were reviewed by me and considered in my medical decision making (see chart for details).     Acute pharyngitis due to other specified organisms   Sore throat with low-grade fever.  Strep testing done today was negative however symptoms, duration and physical exam findings are consistent with a bacterial pharyngitis.  We will treat this with antibiotics.  We will treat with the following: Amoxicillin  875 mg twice daily for 10 days.  This is an antibiotic.  Take this with food.  Alternate Tylenol  and ibuprofen  for pain and fevers Make sure to drink plenty of water even if you do not feel like eating solid foods Can try gargling with warm salt water to help with the pain Return to urgent care or PCP if symptoms worsen or fail to resolve.   Final Clinical Impressions(s) / UC Diagnoses   Final diagnoses:  Acute pharyngitis due to other specified organisms     Discharge Instructions      Sore throat with low-grade fever.  Strep testing done today was negative however symptoms, duration and physical exam findings are consistent with a bacterial pharyngitis.  We will treat this with antibiotics.  We will treat with the following: Amoxicillin  875 mg twice daily for 10 days.  This is an antibiotic.  Take this with food.  Alternate Tylenol  and ibuprofen  for pain and fevers Make sure to drink plenty of water even if you do not feel like eating solid foods Can try gargling with warm salt water to help with the pain Return to urgent care or PCP if symptoms worsen or fail to resolve.      ED Prescriptions   None    PDMP not reviewed this encounter.   Teresa Almarie LABOR, [place] 09/22/23 1023

## 2023-09-22 NOTE — ED Triage Notes (Signed)
 Pt c/o sore throat  Onset 6 days ago St's she has not been around anyone else that is sick

## 2023-11-29 ENCOUNTER — Other Ambulatory Visit: Payer: Self-pay

## 2023-11-29 ENCOUNTER — Ambulatory Visit
Admission: EM | Admit: 2023-11-29 | Discharge: 2023-11-29 | Disposition: A | Discharge status: Home / Self Care | Attending: Family Medicine | Admitting: Family Medicine

## 2023-11-29 ENCOUNTER — Encounter: Payer: Self-pay | Admitting: *Deleted

## 2023-11-29 DIAGNOSIS — J0100 Acute maxillary sinusitis, unspecified: Secondary | ICD-10-CM

## 2023-11-29 DIAGNOSIS — J3489 Other specified disorders of nose and nasal sinuses: Secondary | ICD-10-CM | POA: Diagnosis not present

## 2023-11-29 DIAGNOSIS — F1411 Cocaine abuse, in remission: Secondary | ICD-10-CM

## 2023-11-29 MED ORDER — AMOXICILLIN-POT CLAVULANATE 875-125 MG PO TABS: 1.0000 | ORAL_TABLET | Freq: Two times a day (BID) | ORAL | 0 refills | Status: AC

## 2023-11-29 NOTE — ED Provider Notes (Signed)
[facility]   [id number] 11/29/23 Arrival Time: 0807  ASSESSMENT & PLAN:  1. Acute non-recurrent maxillary sinusitis   2. Nasal septal perforation   3. History of cocaine abuse (HCC)    Begin: Meds ordered this encounter  Medications   amoxicillin -clavulanate (AUGMENTIN) 875-125 MG tablet    Sig: Take 1 tablet by mouth every 12 (twelve) hours.    Dispense:  20 tablet    Refill:  0   ENT referral placed. Orders Placed This Encounter  Procedures   Ambulatory referral to ENT    Referral Priority:   Routine    Referral Type:   Consultation    Referral Reason:   Specialty Services Required    Requested Specialty:   Otolaryngology    Number of Visits Requested:   1   Denies current cocaine use.  Reviewed expectations re: course of current medical issues. Questions answered. Outlined signs and symptoms indicating need for more acute intervention. Patient verbalized understanding. After Visit Summary given.   SUBJECTIVE: History from: patient.  Sue Maldonado is a 28 y.o. female who presents with complaint of nasal congestion, post-nasal drainage, and sinus pain. Onset gradual, over past 2 months; h/o cocaine use; denies current..   Social History   Tobacco Use  Smoking Status Light Smoker  Smokeless Tobacco Never  Tobacco Comments   2-3 per day     OBJECTIVE:  Vitals:   11/29/23 0827  BP: 128/83  Pulse: 85  Resp: 18  Temp: 98.1 F (36.7 C)  TempSrc: Oral  SpO2: 98%     General appearance: alert; no distress HEENT: nasal congestion; clear runny nose; throat irritation secondary to post-nasal drainage; bilateral maxillary tenderness to palpation; turbinates boggy; with septal perforation Neck: supple without LAD; trachea midline Lungs: unlabored respirations Skin: warm and dry Psychological: alert and cooperative; normal mood and affect  Allergies  Allergen Reactions   Bee Pollen     Past Medical History:  Diagnosis Date   Chlamydia     Gonorrhea    UTI (lower urinary tract infection)    Family History  Problem Relation Age of Onset   Hypertension Maternal Grandmother    Cancer Maternal Aunt    Social History   Socioeconomic History   Marital status: Single    Spouse name: Not on file   Number of children: Not on file   Years of education: Not on file   Highest education level: Not on file  Occupational History   Not on file  Tobacco Use   Smoking status: Light Smoker   Smokeless tobacco: Never   Tobacco comments:    2-3 per day  Vaping Use   Vaping status: Never Used  Substance and Sexual Activity   Alcohol use: Yes    Comment: socially   Drug use: Not Currently    Types: Cocaine    Comment: that's what caused the holes in my nose   Sexual activity: Never    Birth control/protection: Implant    Comment: nexplanon  Other Topics Concern   Not on file  Social History Narrative   Not on file   Social Drivers of Health   Financial Resource Strain: Not on file  Food Insecurity: Not on file  Transportation Needs: Not on file  Physical Activity: Not on file  Stress: Not on file  Social Connections: Not on file  Intimate Partner Violence: Not on file             [place], MD 11/29/23  0843  

## 2023-11-29 NOTE — ED Triage Notes (Signed)
 Pt reports sinus congestion and drainage for a couple of months. States she used cocaine and her nose has been messed up since then. Denies current use. Eyes itching also. She has tried dayquil without relief.

## 2023-12-06 ENCOUNTER — Encounter (INDEPENDENT_AMBULATORY_CARE_PROVIDER_SITE_OTHER): Payer: Self-pay

## 2024-01-14 ENCOUNTER — Institutional Professional Consult (permissible substitution) (INDEPENDENT_AMBULATORY_CARE_PROVIDER_SITE_OTHER): Admitting: Otolaryngology

## 2024-01-14 NOTE — Progress Notes (Deleted)
 Dear Dr. Rolinda, Here is my assessment for our mutual patient, Sue Maldonado. Thank you for allowing me the opportunity to care for your patient. Please do not hesitate to contact me should you have any other questions. Sincerely, Dr. Eldora Blanch  Otolaryngology Clinic Note Referring provider: Dr. Rolinda HPI:  Initial visit (12/2023): Discussed the use of AI scribe software for clinical note transcription with the patient, who gave verbal consent to proceed.  History of Present Illness     H&N Surgery: *** Personal or FHx of bleeding dz or anesthesia difficulty: no ***  GLP-1: *** AP/AC: ***  Tobacco: ***. Alcohol: ***. Occupation: ***. Lives in *** with ***.  Independent Review of Additional Tests or Records:  Dr. Rolinda (06/19/2018): noted history of cocaine use with nasal septal defect. No sign of infection. Stopped use about 2 weeks ago. No drainage. Cannot smell well; Dx: Nasal septal perforation Dr. Rolinda (11/2023): noted h/o cocaine use, with NSP and acute maxillary sinusitis; Rx: augmentin , ref to ENT Labs CBC and CMP 07/13/2023: WBC 10.5; BUN/Cr 11/0.5  PMH/Meds/All/SocHx/FamHx/ROS:   Past Medical History:  Diagnosis Date   Chlamydia    Gonorrhea    UTI (lower urinary tract infection)      Past Surgical History:  Procedure Laterality Date   NO PAST SURGERIES      Family History  Problem Relation Age of Onset   Hypertension Maternal Grandmother    Cancer Maternal Aunt      Social Connections: Not on file      Current Outpatient Medications:    amoxicillin -clavulanate (AUGMENTIN ) 875-125 MG tablet, Take 1 tablet by mouth every 12 (twelve) hours., Disp: 20 tablet, Rfl: 0   Physical Exam:   There were no vitals taken for this visit.  Salient findings:  CN II-XII intact *** Bilateral EAC clear and TM intact with well pneumatized middle ear spaces Weber 512: *** Rinne 512: AC > BC b/l *** Rine 1024: AC > BC b/l *** Anterior rhinoscopy: Septum ***;  bilateral inferior turbinates with *** No lesions of oral cavity/oropharynx; dentition *** No obviously palpable neck masses/lymphadenopathy/thyromegaly No respiratory distress or stridor***  Seprately Identifiable Procedures:  Prior to initiating any procedures, risks/benefits/alternatives were explained to the patient and verbal consent obtained. None***  Impression & Plans:  Raine Blodgett is a 28 y.o. female with ***  No diagnosis found.   - f/u ***  See below regarding exact medications prescribed this encounter including dosages and route: No orders of the defined types were placed in this encounter.     Thank you for allowing me the opportunity to care for your patient. Please do not hesitate to contact me should you have any other questions.  Sincerely, Eldora Blanch, MD Otolaryngologist (ENT), [facility] Phone: [phone] Fax: [phone]  01/14/2024, 12:54 PM   MDM:  Level *** Complexity/Problems addressed: *** Data complexity: *** independent review of *** - Morbidity: ***  - Prescription Drug prescribed or managed: ***

## 2024-02-07 ENCOUNTER — Institutional Professional Consult (permissible substitution) (INDEPENDENT_AMBULATORY_CARE_PROVIDER_SITE_OTHER): Admitting: Otolaryngology

## 2024-02-07 NOTE — Progress Notes (Deleted)
 Dear Dr. Rolinda, Here is my assessment for our mutual patient, Sue Maldonado. Thank you for allowing me the opportunity to care for your patient. Please do not hesitate to contact me should you have any other questions. Sincerely, Dr. Eldora Blanch  Otolaryngology Clinic Note Referring provider: Dr. Rolinda HPI:  Initial visit (12/2023): Discussed the use of AI scribe software for clinical note transcription with the patient, who gave verbal consent to proceed.  History of Present Illness     H&N Surgery: *** Personal or FHx of bleeding dz or anesthesia difficulty: no ***  GLP-1: *** AP/AC: ***  Tobacco: ***. Alcohol: ***. Occupation: ***. Lives in *** with ***.  Independent Review of Additional Tests or Records:  Dr. Rolinda (06/19/2018): noted history of cocaine use with nasal septal defect. No sign of infection. Stopped use about 2 weeks ago. No drainage. Cannot smell well; Dx: Nasal septal perforation Dr. Rolinda (11/2023): noted h/o cocaine use, with NSP and acute maxillary sinusitis; Rx: augmentin , ref to ENT Labs CBC and CMP 07/13/2023: WBC 10.5; BUN/Cr 11/0.5  PMH/Meds/All/SocHx/FamHx/ROS:   Past Medical History:  Diagnosis Date   Chlamydia    Gonorrhea    UTI (lower urinary tract infection)      Past Surgical History:  Procedure Laterality Date   NO PAST SURGERIES      Family History  Problem Relation Age of Onset   Hypertension Maternal Grandmother    Cancer Maternal Aunt      Social Connections: Not on file      Current Outpatient Medications:    amoxicillin -clavulanate (AUGMENTIN ) 875-125 MG tablet, Take 1 tablet by mouth every 12 (twelve) hours., Disp: 20 tablet, Rfl: 0   Physical Exam:   There were no vitals taken for this visit.  Salient findings:  CN II-XII intact *** Bilateral EAC clear and TM intact with well pneumatized middle ear spaces Weber 512: *** Rinne 512: AC > BC b/l *** Rine 1024: AC > BC b/l *** Anterior rhinoscopy: Septum ***;  bilateral inferior turbinates with *** No lesions of oral cavity/oropharynx; dentition *** No obviously palpable neck masses/lymphadenopathy/thyromegaly No respiratory distress or stridor***  Seprately Identifiable Procedures:  Prior to initiating any procedures, risks/benefits/alternatives were explained to the patient and verbal consent obtained. None***  Impression & Plans:  Sue Maldonado is a 28 y.o. female with ***  No diagnosis found.   - f/u ***  See below regarding exact medications prescribed this encounter including dosages and route: No orders of the defined types were placed in this encounter.     Thank you for allowing me the opportunity to care for your patient. Please do not hesitate to contact me should you have any other questions.  Sincerely, Eldora Blanch, MD Otolaryngologist (ENT), [facility] Phone: [phone] Fax: [phone]  02/07/2024, 8:04 AM   MDM:  Level *** Complexity/Problems addressed: *** Data complexity: *** independent review of *** - Morbidity: ***  - Prescription Drug prescribed or managed: ***
